# Patient Record
Sex: Female | Born: 1945
Health system: Southern US, Community
[De-identification: ages and names within clinical notes are randomized; demographics above are authoritative.]

## PROBLEM LIST (undated history)

## (undated) DIAGNOSIS — D649 Anemia, unspecified: Secondary | ICD-10-CM

## (undated) DIAGNOSIS — I10 Essential (primary) hypertension: Secondary | ICD-10-CM

## (undated) DIAGNOSIS — E079 Disorder of thyroid, unspecified: Secondary | ICD-10-CM

## (undated) HISTORY — PX: ABDOMINAL HYSTERECTOMY: SHX81

## (undated) HISTORY — PX: APPENDECTOMY: SHX54

---

## 2015-12-26 ENCOUNTER — Emergency Department (HOSPITAL_COMMUNITY)
Admission: EM | Admit: 2015-12-26 | Discharge: 2015-12-26 | Disposition: A | Discharge status: Home / Self Care | Payer: Commercial Managed Care - HMO | Attending: Emergency Medicine | Admitting: Emergency Medicine

## 2015-12-26 ENCOUNTER — Emergency Department (HOSPITAL_COMMUNITY): Payer: Commercial Managed Care - HMO

## 2015-12-26 ENCOUNTER — Encounter (HOSPITAL_COMMUNITY): Payer: Self-pay | Admitting: Emergency Medicine

## 2015-12-26 DIAGNOSIS — R05 Cough: Secondary | ICD-10-CM | POA: Diagnosis not present

## 2015-12-26 DIAGNOSIS — J40 Bronchitis, not specified as acute or chronic: Secondary | ICD-10-CM | POA: Diagnosis not present

## 2015-12-26 HISTORY — DX: Disorder of thyroid, unspecified: E07.9

## 2015-12-26 HISTORY — DX: Anemia, unspecified: D64.9

## 2015-12-26 HISTORY — DX: Essential (primary) hypertension: I10

## 2015-12-26 MED ORDER — AZITHROMYCIN 250 MG PO TABS: ORAL_TABLET | ORAL | 0 refills | Status: DC

## 2015-12-26 NOTE — ED Triage Notes (Addendum)
Patient c/o productive cough with sinus congestion/pressure and sore throat. Per patient started yesterday as a sore throat and cough started this morning. Patient reports thick green sputum. Per patient soreness and aching in chest with cough. Denies any fevers.

## 2015-12-26 NOTE — Discharge Instructions (Signed)
Take your blood pressure medicine. Follow-up with your family doctor if not getting better

## 2015-12-26 NOTE — ED Provider Notes (Signed)
[facility]PT Provider Note   CSN: [id number] Arrival date & time: 12/26/15  4098  By signing my name below, I, Majel Homer, attest that this documentation has been prepared under the direction and in the presence of Bethann Berkshire, MD . Electronically Signed: Majel Homer, Scribe. 12/26/2015. 10:27 AM.  History   Chief Complaint Chief Complaint  Patient presents with  . Cough   The history is provided by the patient. No language interpreter was used.  Cough  This is a new problem. The current episode started yesterday. The problem has been gradually worsening. The cough is productive of sputum. There has been no fever. Associated symptoms include headaches, sore throat and wheezing. Pertinent negatives include no chest pain. She has tried nothing for the symptoms. She is not a smoker.   HPI Comments: Maureen Sanchez is a 70 y.o. female who presents to the Emergency Department complaining of gradually worsening, productive cough with thick, green sputum that began yesterday. She notes associated wheezing, headache, sore throat, congestion and sinus pressure. Pt denies fever.   No past medical history on file.  There are no active problems to display for this patient.  No past surgical history on file.  OB History    No data available     Home Medications    Prior to Admission medications   Not on File    Family History No family history on file.  Social History Social History  Substance Use Topics  . Smoking status: Not on file  . Smokeless tobacco: Not on file  . Alcohol use Not on file     Allergies   Review of patient's allergies indicates not on file.   Review of Systems Review of Systems  Constitutional: Negative for appetite change, fatigue and fever.  HENT: Positive for congestion, sinus pressure and sore throat. Negative for ear discharge.   Eyes: Negative for discharge.  Respiratory: Positive for cough and wheezing.   Cardiovascular: Negative for chest  pain.  Gastrointestinal: Negative for abdominal pain and diarrhea.  Genitourinary: Negative for frequency and hematuria.  Musculoskeletal: Negative for back pain.  Skin: Negative for rash.  Neurological: Positive for headaches. Negative for seizures.  Psychiatric/Behavioral: Negative for hallucinations.   Physical Exam Updated Vital Signs BP (S) (!) 240/98 (BP Location: Right Arm) Comment: Patient states that she has high blood pressure but doesnt take her medication  Pulse 89   Temp 98.1 F (36.7 C) (Oral)   Resp 24   Ht 5\' 3"  (1.6 m)   Wt 140 lb (63.5 kg)   SpO2 99%   BMI 24.80 kg/m   Physical Exam  Constitutional: She is oriented to person, place, and time. She appears well-developed.  HENT:  Head: Normocephalic.  Eyes: Conjunctivae and EOM are normal. No scleral icterus.  Neck: Neck supple. No thyromegaly present.  Cardiovascular: Normal rate and regular rhythm.  Exam reveals no gallop and no friction rub.   No murmur heard. Pulmonary/Chest: No stridor. She has wheezes. She has no rales. She exhibits no tenderness.  Minimal wheezing   Abdominal: She exhibits no distension. There is no tenderness. There is no rebound.  Musculoskeletal: Normal range of motion. She exhibits no edema.  Lymphadenopathy:    She has no cervical adenopathy.  Neurological: She is oriented to person, place, and time. She exhibits normal muscle tone. Coordination normal.  Skin: No rash noted. No erythema.  Psychiatric: She has a normal mood and affect. Her behavior is normal.   ED Treatments /  Results  Labs (all labs ordered are listed, but only abnormal results are displayed) Labs Reviewed - No data to display  EKG  EKG Interpretation None       Radiology Dg Chest 2 View  Result Date: 12/26/2015 CLINICAL DATA:  Cough. EXAM: CHEST  2 VIEW COMPARISON:  03/26/2014 FINDINGS: The heart size is within normal limits. Stable mild pulmonary interstitial prominence likely reflects mild chronic  lung disease. There is no evidence of pulmonary edema, consolidation, pneumothorax, nodule or pleural fluid. Stable mild tortuosity of the thoracic aorta. The visualized skeletal structures are unremarkable. IMPRESSION: No active disease.  Suspect mild chronic interstitial lung disease. Electronically Signed   By: Irish LackGlenn  Yamagata M.D.   On: 12/26/2015 10:00   Procedures Procedures (including critical care time)  Medications Ordered in ED Medications - No data to display  DIAGNOSTIC STUDIES:  Oxygen Saturation is 99% on RA, normal by my interpretation.    COORDINATION OF CARE:  10:23 AM Discussed treatment plan with pt at bedside and pt agreed to plan.  Initial Impression / Assessment and Plan / ED Course  I have reviewed the triage vital signs and the nursing notes.  Pertinent labs & imaging results that were available during my care of the patient were reviewed by me and considered in my medical decision making (see chart for details).  Clinical Course    Patient with bronchitis she will given a Z-Pak. Patient also was told that she has to take her blood pressure medicine she's not been taking it and I explained the reasons.  I personally performed the services described in this documentation, which was scribed in my presence. The recorded information has been reviewed and is accurate.   Final Clinical Impressions(s) / ED Diagnoses   Final diagnoses:  None    New Prescriptions New Prescriptions   No medications on file  The chart was scribed for me under my direct supervision.  I personally performed the history, physical, and medical decision making and all procedures in the evaluation of this patient.Bethann Berkshire.    Montae Stager, MD 12/26/15 [phone]

## 2016-01-28 DIAGNOSIS — E785 Hyperlipidemia, unspecified: Secondary | ICD-10-CM | POA: Diagnosis not present

## 2016-01-28 DIAGNOSIS — E039 Hypothyroidism, unspecified: Secondary | ICD-10-CM | POA: Diagnosis not present

## 2016-01-28 DIAGNOSIS — I10 Essential (primary) hypertension: Secondary | ICD-10-CM | POA: Diagnosis not present

## 2016-02-04 DIAGNOSIS — I10 Essential (primary) hypertension: Secondary | ICD-10-CM | POA: Diagnosis not present

## 2016-02-04 DIAGNOSIS — E782 Mixed hyperlipidemia: Secondary | ICD-10-CM | POA: Diagnosis not present

## 2016-02-04 DIAGNOSIS — H539 Unspecified visual disturbance: Secondary | ICD-10-CM | POA: Diagnosis not present

## 2016-02-16 DIAGNOSIS — I10 Essential (primary) hypertension: Secondary | ICD-10-CM | POA: Diagnosis not present

## 2016-02-16 DIAGNOSIS — E782 Mixed hyperlipidemia: Secondary | ICD-10-CM | POA: Diagnosis not present

## 2016-02-16 DIAGNOSIS — H539 Unspecified visual disturbance: Secondary | ICD-10-CM | POA: Diagnosis not present

## 2016-03-03 DIAGNOSIS — E782 Mixed hyperlipidemia: Secondary | ICD-10-CM | POA: Diagnosis not present

## 2016-03-03 DIAGNOSIS — I10 Essential (primary) hypertension: Secondary | ICD-10-CM | POA: Diagnosis not present

## 2016-03-29 DIAGNOSIS — H25813 Combined forms of age-related cataract, bilateral: Secondary | ICD-10-CM | POA: Diagnosis not present

## 2016-04-19 ENCOUNTER — Encounter (HOSPITAL_COMMUNITY): Payer: Self-pay

## 2016-04-19 ENCOUNTER — Emergency Department (HOSPITAL_COMMUNITY): Payer: Medicare HMO

## 2016-04-19 ENCOUNTER — Emergency Department (HOSPITAL_COMMUNITY)
Admission: EM | Admit: 2016-04-19 | Discharge: 2016-04-19 | Disposition: A | Discharge status: Home / Self Care | Payer: Medicare HMO | Attending: Emergency Medicine | Admitting: Emergency Medicine

## 2016-04-19 DIAGNOSIS — I10 Essential (primary) hypertension: Secondary | ICD-10-CM

## 2016-04-19 DIAGNOSIS — H6691 Otitis media, unspecified, right ear: Secondary | ICD-10-CM | POA: Diagnosis not present

## 2016-04-19 DIAGNOSIS — H9201 Otalgia, right ear: Secondary | ICD-10-CM | POA: Insufficient documentation

## 2016-04-19 DIAGNOSIS — Z79899 Other long term (current) drug therapy: Secondary | ICD-10-CM | POA: Diagnosis not present

## 2016-04-19 DIAGNOSIS — H0520 Unspecified exophthalmos: Secondary | ICD-10-CM | POA: Diagnosis not present

## 2016-04-19 LAB — CBC WITH DIFFERENTIAL/PLATELET
BASOS PCT: 1 %
Basophils Absolute: 0.1 10*3/uL (ref 0.0–0.1)
EOS ABS: 0.3 10*3/uL (ref 0.0–0.7)
EOS PCT: 4 %
HCT: 42 % (ref 36.0–46.0)
Hemoglobin: 13.6 g/dL (ref 12.0–15.0)
Lymphocytes Relative: 22 %
Lymphs Abs: 1.7 10*3/uL (ref 0.7–4.0)
MCH: 29.2 pg (ref 26.0–34.0)
MCHC: 32.4 g/dL (ref 30.0–36.0)
MCV: 90.1 fL (ref 78.0–100.0)
Monocytes Absolute: 0.3 10*3/uL (ref 0.1–1.0)
Monocytes Relative: 5 %
Neutro Abs: 5.2 10*3/uL (ref 1.7–7.7)
Neutrophils Relative %: 68 %
PLATELETS: 253 10*3/uL (ref 150–400)
RBC: 4.66 MIL/uL (ref 3.87–5.11)
RDW: 14.1 % (ref 11.5–15.5)
WBC: 7.6 10*3/uL (ref 4.0–10.5)

## 2016-04-19 LAB — BASIC METABOLIC PANEL
ANION GAP: 8 (ref 5–15)
BUN: 14 mg/dL (ref 6–20)
CALCIUM: 9.1 mg/dL (ref 8.9–10.3)
CO2: 24 mmol/L (ref 22–32)
CREATININE: 0.98 mg/dL (ref 0.44–1.00)
Chloride: 105 mmol/L (ref 101–111)
GFR, EST NON AFRICAN AMERICAN: 56 mL/min — AB (ref >60)
Glucose, Bld: 106 mg/dL — ABNORMAL HIGH (ref 65–99)
Potassium: 3.7 mmol/L (ref 3.5–5.1)
Sodium: 137 mmol/L (ref 135–145)

## 2016-04-19 LAB — SEDIMENTATION RATE: SED RATE: 28 mm/h — AB (ref 0–22)

## 2016-04-19 MED ORDER — SODIUM CHLORIDE 0.9 % IV BOLUS (SEPSIS)
500.0000 mL | Freq: Once | INTRAVENOUS | Status: AC
  Administered 2016-04-19: 500 mL via INTRAVENOUS

## 2016-04-19 MED ORDER — FENTANYL CITRATE (PF) 100 MCG/2ML IJ SOLN
50.0000 ug | INTRAMUSCULAR | Status: DC | PRN
  Administered 2016-04-19 (×2): 50 ug via INTRAVENOUS
  Filled 2016-04-19 (×2): qty 2

## 2016-04-19 MED ORDER — AMOXICILLIN-POT CLAVULANATE 875-125 MG PO TABS: 1.0000 | ORAL_TABLET | Freq: Two times a day (BID) | ORAL | 0 refills | Status: AC

## 2016-04-19 MED ORDER — LABETALOL HCL 5 MG/ML IV SOLN
10.0000 mg | Freq: Once | INTRAVENOUS | Status: AC
  Administered 2016-04-19: 10 mg via INTRAVENOUS
  Filled 2016-04-19: qty 4

## 2016-04-19 MED ORDER — HYDROCODONE-ACETAMINOPHEN 5-325 MG PO TABS: 1.0000 | ORAL_TABLET | ORAL | 0 refills | Status: AC | PRN

## 2016-04-19 MED ORDER — IOPAMIDOL (ISOVUE-300) INJECTION 61%
75.0000 mL | Freq: Once | INTRAVENOUS | Status: AC | PRN
  Administered 2016-04-19: 75 mL via INTRAVENOUS

## 2016-04-19 NOTE — Discharge Instructions (Signed)
If you were given medicines take as directed.  If you are on coumadin or contraceptives realize their levels and effectiveness is altered by many different medicines.  If you have any reaction (rash, tongues swelling, other) to the medicines stop taking and see a physician.    If your blood pressure was elevated in the ER make sure you follow up for management with a primary doctor or return for chest pain, shortness of breath or stroke symptoms.  Please follow up as directed and return to the ER or see a physician for new or worsening symptoms.  Thank you. Vitals:   04/19/16 1200 04/19/16 1300 04/19/16 1400 04/19/16 1416  BP: (!) 229/88 (!) 219/91 (!) 234/116 200/90  Pulse:  65 78 70  Resp:      Temp:      TempSrc:      SpO2:  98% 100% 100%  Weight:      Height:

## 2016-04-19 NOTE — ED Notes (Signed)
Pt does not have a ride home, she drove herself to emergency department.  Explained to pt that she had to wait x4hours after receiving a narcotic pain medication by iv before driving.  Pt states she is not waiting here that long.  Pt informed that she could be charged with driving under the influence, pt continues to state she is leaving.

## 2016-04-19 NOTE — ED Triage Notes (Addendum)
Pt reports right ear pain x 3 days. Increasing in pain. Pain mainly behind the right ear

## 2016-04-19 NOTE — ED Provider Notes (Signed)
[facility] Provider Note   CSN: [id number] Arrival date & time: 04/19/16  4098  By signing my name below, I, Cynda Acres, attest that this documentation has been prepared under the direction and in the presence of Blane Ohara, MD. Electronically Signed: Cynda Acres, Scribe. 04/19/16. 9:54 AM.   History   Chief Complaint Chief Complaint  Patient presents with  . Otalgia   HPI Comments: Maureen Sanchez is a 71 y.o. female with a hx of HTN,  who presents to the Emergency Department complaining of gradually worsening, right ear pain that began 3 days ago. Patient states the pain is around her ear, not inside. When asked to point to the area of pain  she points at the mastoid process. Patient has no associated symptoms. Patient states the pain is severe. No modifying factors indicated. Patient denies any injury, fever, ear drainage, or headache.   The history is provided by the patient. No language interpreter was used.    Past Medical History:  Diagnosis Date  . Anemia   . Hypertension   . Thyroid disease     There are no active problems to display for this patient.   Past Surgical History:  Procedure Laterality Date  . ABDOMINAL HYSTERECTOMY    . APPENDECTOMY      OB History    Gravida Para Term Preterm AB Living   3 3 3     3    SAB TAB Ectopic Multiple Live Births                   Home Medications    Prior to Admission medications   Medication Sig Start Date End Date Taking? Authorizing Provider  azithromycin (ZITHROMAX Z-PAK) 250 MG tablet 2 po day one, then 1 daily x 4 days 12/26/15   Bethann Berkshire, MD    Family History Family History  Problem Relation Age of Onset  . Heart failure Mother   . Cancer Father     Social History Social History  Substance Use Topics  . Smoking status: Never Smoker  . Smokeless tobacco: Never Used  . Alcohol use No     Allergies   Patient has no known allergies.   Review of Systems Review of Systems    Constitutional: Negative for fever.  HENT: Positive for ear pain. Negative for ear discharge.   Neurological: Negative for headaches.  All other systems reviewed and are negative.    Physical Exam Updated Vital Signs BP (!) 230/110 (BP Location: Left Arm)   Pulse 98   Temp 97.5 F (36.4 C) (Oral)   Resp 16   Ht 5\' 3"  (1.6 m)   Wt 140 lb (63.5 kg)   SpO2 100%   BMI 24.80 kg/m   Physical Exam  Constitutional: She is oriented to person, place, and time. She appears well-developed.  The patient is uncomfortable.   HENT:  Head: Normocephalic and atraumatic.  Mouth/Throat: Oropharynx is clear and moist.  Left TM normal. No tenderness to the left ear. Mild surrounding erythema of the left ear. No drainage,  minimal serumen in the canal. TM minimal injection, no fluid line. Tenderness in right mastoid process. Pinna of the ear.   Eyes: Conjunctivae and EOM are normal. Pupils are equal, round, and reactive to light.  Neck: Normal range of motion. Neck supple.  Neck is supple.   Cardiovascular: Normal rate.   Pulmonary/Chest: Effort normal.  Abdominal: Soft. Bowel sounds are normal.  Musculoskeletal: Normal range of motion.  Neurological: She is alert and oriented to person, place, and time.  Skin: Skin is warm and dry.  Psychiatric: She has a normal mood and affect.     ED Treatments / Results  DIAGNOSTIC STUDIES: Oxygen Saturation is 100% on RA, normal by my interpretation.    COORDINATION OF CARE: 9:51 AM Discussed treatment plan with pt at bedside and pt agreed to plan, which includes pain medication.   Labs (all labs ordered are listed, but only abnormal results are displayed) Labs Reviewed - No data to display  EKG  EKG Interpretation None       Radiology No results found.  Procedures Procedures (including critical care time)  Medications Ordered in ED Medications - No data to display   Initial Impression / Assessment and Plan / ED Course  I have  reviewed the triage vital signs and the nursing notes.  Pertinent labs & imaging results that were available during my care of the patient were reviewed by me and considered in my medical decision making (see chart for details).   patient resents with right ear pain and mastoid tenderness. Discussed case with radiology CT with contrast no acute finding mastoid unremarkable. Patient improved in the ER patient did not take her blood pressure meds today. Pain meds given. Plan for antibiotics and close outpatient follow-up.  Results and differential diagnosis were discussed with the patient/parent/guardian. Xrays were independently reviewed by myself.  Close follow up outpatient was discussed, comfortable with the plan.   Medications  fentaNYL (SUBLIMAZE) injection 50 mcg (50 mcg Intravenous Given 04/19/16 1413)  sodium chloride 0.9 % bolus 500 mL (0 mLs Intravenous Stopped 04/19/16 1230)  iopamidol (ISOVUE-300) 61 % injection 75 mL (75 mLs Intravenous Contrast Given 04/19/16 1215)  labetalol (NORMODYNE,TRANDATE) injection 10 mg (10 mg Intravenous Given 04/19/16 1409)    Vitals:   04/19/16 1300 04/19/16 1400 04/19/16 1416 04/19/16 1459  BP: (!) 219/91 (!) 234/116 200/90 192/69  Pulse: 65 78 70 72  Resp:    18  Temp:      TempSrc:      SpO2: 98% 100% 100%   Weight:      Height:        Final diagnoses:  Otalgia of right ear  Essential hypertension     Final Clinical Impressions(s) / ED Diagnoses   Final diagnoses:  None    New Prescriptions New Prescriptions   No medications on file      Blane OharaJoshua Alda Gaultney, MD 04/19/16 1644

## 2016-08-30 DIAGNOSIS — E039 Hypothyroidism, unspecified: Secondary | ICD-10-CM | POA: Diagnosis not present

## 2016-08-30 DIAGNOSIS — E782 Mixed hyperlipidemia: Secondary | ICD-10-CM | POA: Diagnosis not present

## 2016-08-30 DIAGNOSIS — I10 Essential (primary) hypertension: Secondary | ICD-10-CM | POA: Diagnosis not present

## 2016-08-30 DIAGNOSIS — E785 Hyperlipidemia, unspecified: Secondary | ICD-10-CM | POA: Diagnosis not present

## 2016-08-30 DIAGNOSIS — R011 Cardiac murmur, unspecified: Secondary | ICD-10-CM | POA: Diagnosis not present

## 2016-08-31 DIAGNOSIS — E782 Mixed hyperlipidemia: Secondary | ICD-10-CM | POA: Diagnosis not present

## 2016-08-31 DIAGNOSIS — E039 Hypothyroidism, unspecified: Secondary | ICD-10-CM | POA: Diagnosis not present

## 2016-08-31 DIAGNOSIS — I10 Essential (primary) hypertension: Secondary | ICD-10-CM | POA: Diagnosis not present

## 2016-11-14 DIAGNOSIS — E538 Deficiency of other specified B group vitamins: Secondary | ICD-10-CM | POA: Diagnosis not present

## 2016-11-14 DIAGNOSIS — I10 Essential (primary) hypertension: Secondary | ICD-10-CM | POA: Diagnosis not present

## 2016-11-14 DIAGNOSIS — R739 Hyperglycemia, unspecified: Secondary | ICD-10-CM | POA: Diagnosis not present

## 2016-11-14 DIAGNOSIS — E039 Hypothyroidism, unspecified: Secondary | ICD-10-CM | POA: Diagnosis not present

## 2016-11-14 DIAGNOSIS — E785 Hyperlipidemia, unspecified: Secondary | ICD-10-CM | POA: Diagnosis not present

## 2016-11-14 DIAGNOSIS — E559 Vitamin D deficiency, unspecified: Secondary | ICD-10-CM | POA: Diagnosis not present

## 2016-11-17 DIAGNOSIS — I10 Essential (primary) hypertension: Secondary | ICD-10-CM | POA: Diagnosis not present

## 2016-12-12 DIAGNOSIS — E782 Mixed hyperlipidemia: Secondary | ICD-10-CM | POA: Diagnosis not present

## 2016-12-12 DIAGNOSIS — E039 Hypothyroidism, unspecified: Secondary | ICD-10-CM | POA: Diagnosis not present

## 2016-12-12 DIAGNOSIS — I10 Essential (primary) hypertension: Secondary | ICD-10-CM | POA: Diagnosis not present

## 2016-12-13 DIAGNOSIS — E785 Hyperlipidemia, unspecified: Secondary | ICD-10-CM | POA: Diagnosis not present

## 2016-12-13 DIAGNOSIS — I10 Essential (primary) hypertension: Secondary | ICD-10-CM | POA: Diagnosis not present

## 2016-12-13 DIAGNOSIS — E039 Hypothyroidism, unspecified: Secondary | ICD-10-CM | POA: Diagnosis not present

## 2016-12-20 DIAGNOSIS — K921 Melena: Secondary | ICD-10-CM | POA: Diagnosis not present

## 2016-12-20 DIAGNOSIS — E782 Mixed hyperlipidemia: Secondary | ICD-10-CM | POA: Diagnosis not present

## 2016-12-20 DIAGNOSIS — E559 Vitamin D deficiency, unspecified: Secondary | ICD-10-CM | POA: Diagnosis not present

## 2016-12-20 DIAGNOSIS — H0520 Unspecified exophthalmos: Secondary | ICD-10-CM | POA: Diagnosis not present

## 2016-12-20 DIAGNOSIS — R32 Unspecified urinary incontinence: Secondary | ICD-10-CM | POA: Diagnosis not present

## 2016-12-20 DIAGNOSIS — Z0001 Encounter for general adult medical examination with abnormal findings: Secondary | ICD-10-CM | POA: Diagnosis not present

## 2016-12-20 DIAGNOSIS — Z9114 Patient's other noncompliance with medication regimen: Secondary | ICD-10-CM | POA: Diagnosis not present

## 2016-12-20 DIAGNOSIS — E039 Hypothyroidism, unspecified: Secondary | ICD-10-CM | POA: Diagnosis not present

## 2016-12-20 DIAGNOSIS — R011 Cardiac murmur, unspecified: Secondary | ICD-10-CM | POA: Diagnosis not present

## 2017-01-13 DIAGNOSIS — I10 Essential (primary) hypertension: Secondary | ICD-10-CM | POA: Diagnosis not present

## 2017-01-13 DIAGNOSIS — E039 Hypothyroidism, unspecified: Secondary | ICD-10-CM | POA: Diagnosis not present

## 2017-01-13 DIAGNOSIS — E559 Vitamin D deficiency, unspecified: Secondary | ICD-10-CM | POA: Diagnosis not present

## 2017-02-07 DIAGNOSIS — E039 Hypothyroidism, unspecified: Secondary | ICD-10-CM | POA: Diagnosis not present

## 2017-02-07 DIAGNOSIS — N183 Chronic kidney disease, stage 3 (moderate): Secondary | ICD-10-CM | POA: Diagnosis not present

## 2017-02-07 DIAGNOSIS — E559 Vitamin D deficiency, unspecified: Secondary | ICD-10-CM | POA: Diagnosis not present

## 2017-02-07 DIAGNOSIS — I10 Essential (primary) hypertension: Secondary | ICD-10-CM | POA: Diagnosis not present

## 2017-02-07 DIAGNOSIS — E782 Mixed hyperlipidemia: Secondary | ICD-10-CM | POA: Diagnosis not present

## 2017-03-07 DIAGNOSIS — E039 Hypothyroidism, unspecified: Secondary | ICD-10-CM | POA: Diagnosis not present

## 2017-03-07 DIAGNOSIS — Z79899 Other long term (current) drug therapy: Secondary | ICD-10-CM | POA: Diagnosis not present

## 2017-03-07 DIAGNOSIS — I10 Essential (primary) hypertension: Secondary | ICD-10-CM | POA: Diagnosis not present

## 2017-03-07 DIAGNOSIS — Z9114 Patient's other noncompliance with medication regimen: Secondary | ICD-10-CM | POA: Diagnosis not present

## 2017-04-04 DIAGNOSIS — E039 Hypothyroidism, unspecified: Secondary | ICD-10-CM | POA: Diagnosis not present

## 2017-05-10 DIAGNOSIS — E039 Hypothyroidism, unspecified: Secondary | ICD-10-CM | POA: Diagnosis not present

## 2017-05-10 DIAGNOSIS — I10 Essential (primary) hypertension: Secondary | ICD-10-CM | POA: Diagnosis not present

## 2017-05-10 DIAGNOSIS — Z79899 Other long term (current) drug therapy: Secondary | ICD-10-CM | POA: Diagnosis not present

## 2017-05-30 IMAGING — CT CT TEMPORAL BONES W/ CM
2 of 6 series · 14 of 40 positions shown, 17 images · IV contrast (iopamidol)
Comparison: None.

CLINICAL DATA: Right ear pain for 3 days with mastoid tenderness.

EXAM:
CT TEMPORAL BONES WITH CONTRAST
TECHNIQUE: Axial and coronal plane CT imaging of the petrous temporal bones was
performed with thin-collimation image reconstruction after
intravenous contrast administration. Multiplanar CT image
reconstructions were also generated.
CONTRAST:  75mL [id number] IOPAMIDOL (P98ZNT-FYY) INJECTION 61%

[Series 4: coronal bone. · coronal · 0.22mm/px · 2 of 192 slices shown]
[im 64/192  bone]
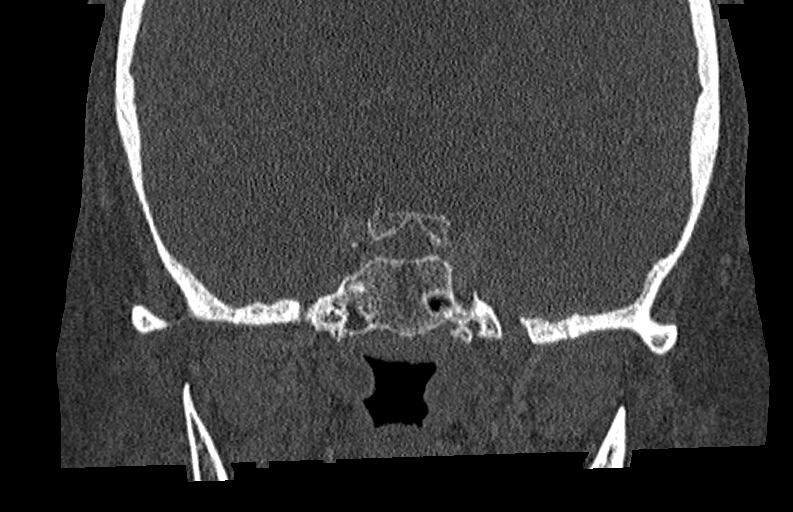
[im 128/192  bone]
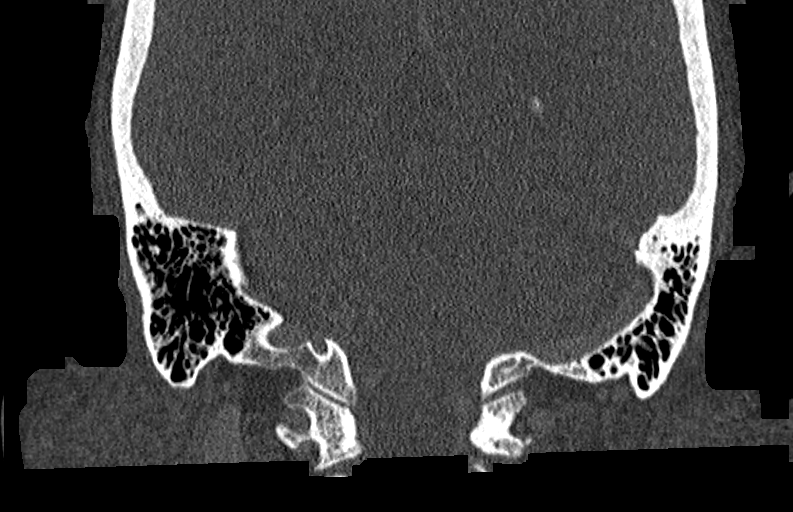

[Series 5: ax mag right · axial · 0.21mm/px · z∈[+1441,+1524]mm · 12 of 164 slices shown, 15 images]
[im 13/164  brain]
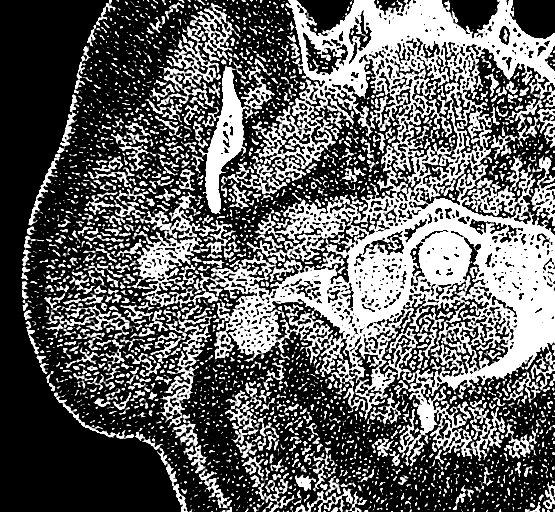
[im 13/164  bone]
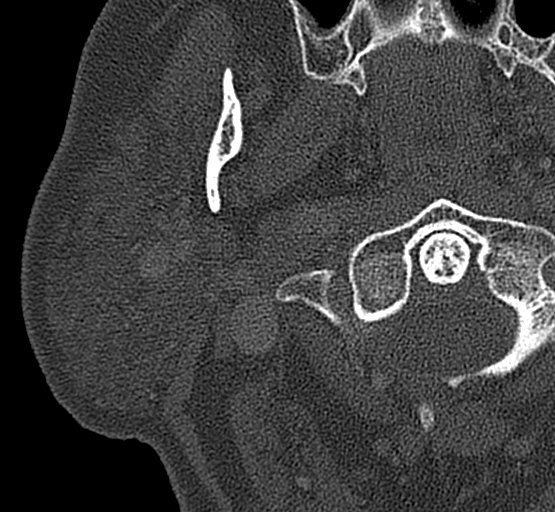
[im 26/164  bone]
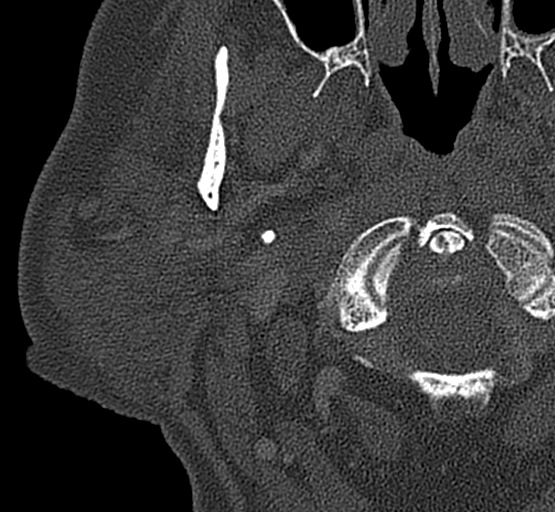
[im 38/164  bone]
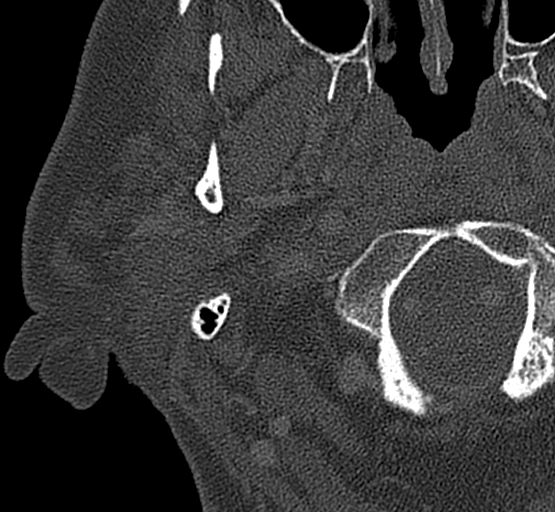
[im 51/164  bone]
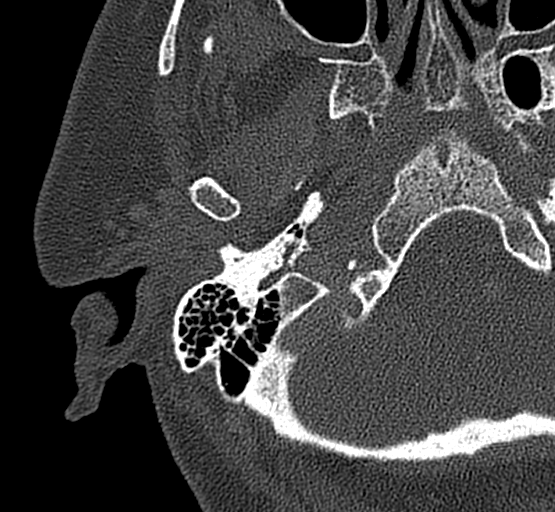
[im 63/164  brain]
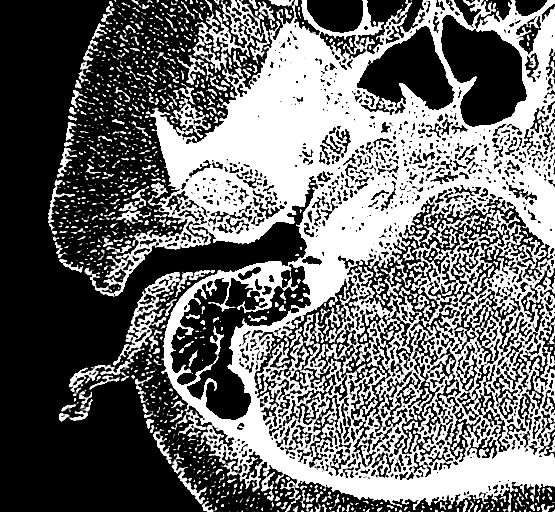
[im 63/164  bone]
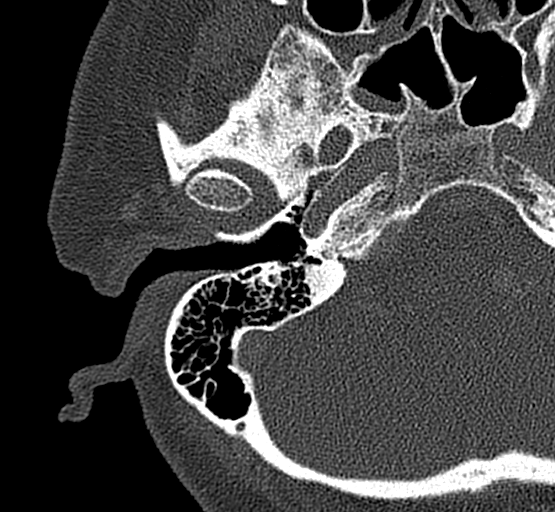
[im 76/164  bone]
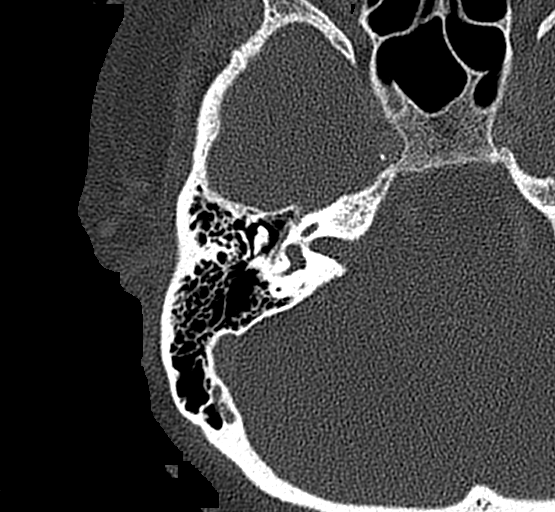
[im 88/164  bone]
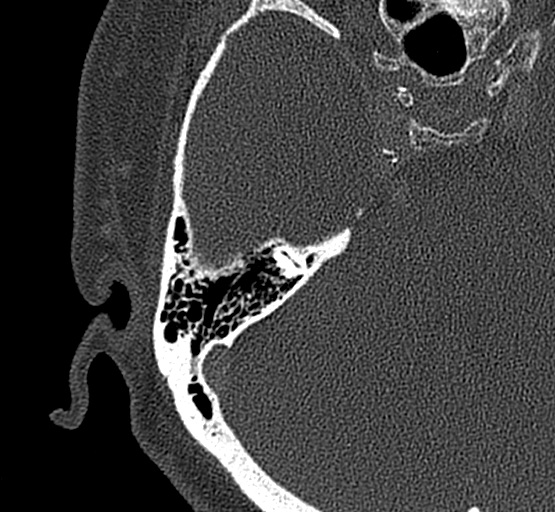
[im 101/164  bone]
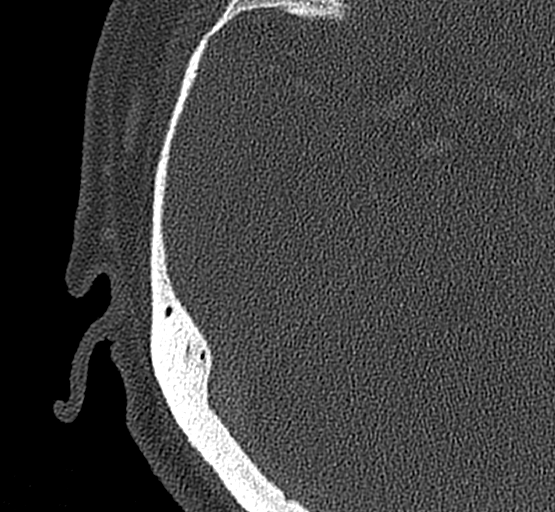
[im 113/164  brain]
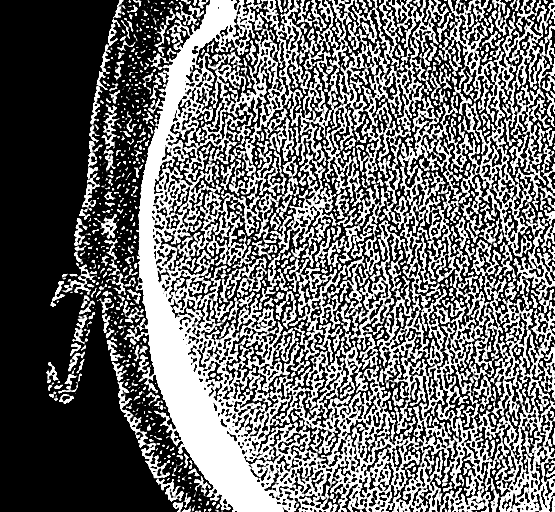
[im 113/164  bone]
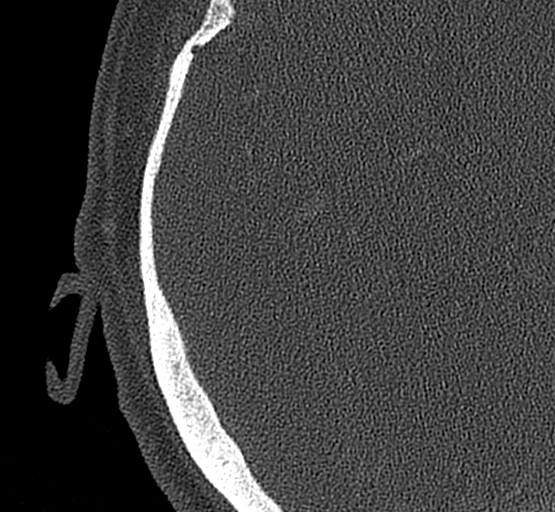
[im 126/164  bone]
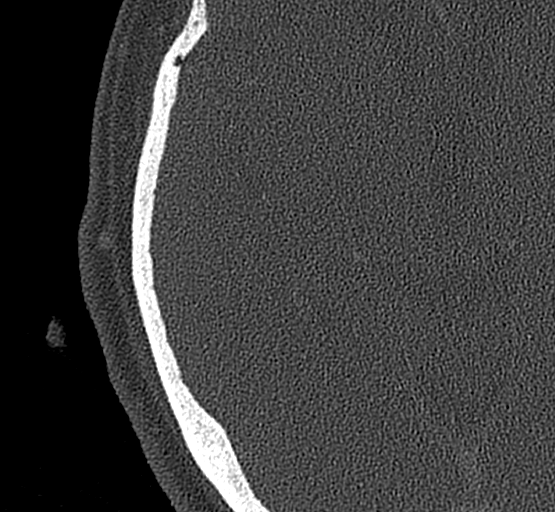
[im 138/164  bone]
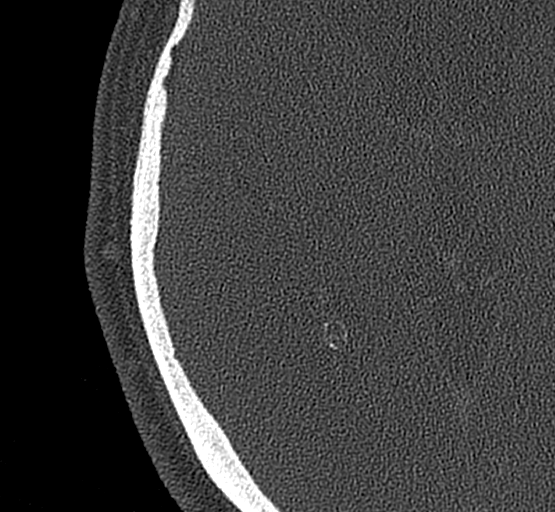
[im 151/164  bone]
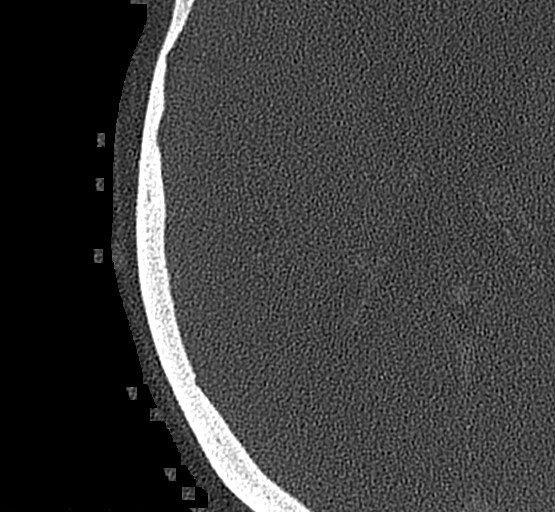

[14 of 40 positions shown; findings below may reference images not displayed]

FINDINGS: RIGHT: Minimal strandy debris in the external auditory canal. The
ossicles appear intact. The tympanic cavity is clear. The internal
auditory canal, cochlea, vestibule, and semicircular canals are
unremarkable. The mastoid air cells are clear.

LEFT: The external auditory canal and tympanic membrane are
unremarkable. The ossicles appear intact. The tympanic cavity is
clear. The internal auditory canal, cochlea, vestibule, and
semicircular canals are unremarkable. The mastoid air cells are
clear.

There is minimal fluid in the right sphenoid sinus. Bilateral
exophthalmos is noted along with mild low-density enlargement of
multiple extraocular muscles bilaterally, incompletely evaluated
though with most notable involvement of the inferior rectus muscle
on the left. The visualized portion of the brain demonstrates
cerebral atrophy and periventricular white matter hypoattenuation
suggesting chronic small vessel ischemia, incompletely evaluated.
IMPRESSION: 1. Unremarkable appearance of the temporal bones. No evidence of
mastoiditis or other acute abnormality. Minimal debris in the right
EAC.
2. Bilateral exophthalmos and extraocular muscle enlargement, likely
related to the patient's history of thyroid disease.

## 2017-07-26 DIAGNOSIS — Z0131 Encounter for examination of blood pressure with abnormal findings: Secondary | ICD-10-CM | POA: Diagnosis not present

## 2017-07-26 DIAGNOSIS — I10 Essential (primary) hypertension: Secondary | ICD-10-CM | POA: Diagnosis not present

## 2017-07-26 DIAGNOSIS — Z79899 Other long term (current) drug therapy: Secondary | ICD-10-CM | POA: Diagnosis not present

## 2017-07-26 DIAGNOSIS — E039 Hypothyroidism, unspecified: Secondary | ICD-10-CM | POA: Diagnosis not present

## 2017-08-01 DIAGNOSIS — E039 Hypothyroidism, unspecified: Secondary | ICD-10-CM | POA: Diagnosis not present

## 2017-08-01 DIAGNOSIS — I10 Essential (primary) hypertension: Secondary | ICD-10-CM | POA: Diagnosis not present

## 2017-08-09 DIAGNOSIS — Z79899 Other long term (current) drug therapy: Secondary | ICD-10-CM | POA: Diagnosis not present

## 2017-08-09 DIAGNOSIS — I10 Essential (primary) hypertension: Secondary | ICD-10-CM | POA: Diagnosis not present

## 2017-08-09 DIAGNOSIS — G4700 Insomnia, unspecified: Secondary | ICD-10-CM | POA: Diagnosis not present

## 2017-08-09 DIAGNOSIS — N179 Acute kidney failure, unspecified: Secondary | ICD-10-CM | POA: Diagnosis not present

## 2017-08-09 DIAGNOSIS — N185 Chronic kidney disease, stage 5: Secondary | ICD-10-CM | POA: Diagnosis not present

## 2017-08-09 DIAGNOSIS — R630 Anorexia: Secondary | ICD-10-CM | POA: Diagnosis not present

## 2017-08-09 DIAGNOSIS — Z9114 Patient's other noncompliance with medication regimen: Secondary | ICD-10-CM | POA: Diagnosis not present

## 2017-08-09 DIAGNOSIS — E039 Hypothyroidism, unspecified: Secondary | ICD-10-CM | POA: Diagnosis not present

## 2017-08-10 DIAGNOSIS — N179 Acute kidney failure, unspecified: Secondary | ICD-10-CM | POA: Diagnosis not present

## 2017-08-10 DIAGNOSIS — I10 Essential (primary) hypertension: Secondary | ICD-10-CM | POA: Diagnosis not present

## 2017-09-13 ENCOUNTER — Ambulatory Visit (HOSPITAL_COMMUNITY)
Admission: RE | Admit: 2017-09-13 | Discharge: 2017-09-13 | Disposition: A | Discharge status: Home / Self Care | Payer: Medicare HMO | Source: Ambulatory Visit | Attending: Nephrology | Admitting: Nephrology

## 2017-09-13 ENCOUNTER — Other Ambulatory Visit (HOSPITAL_COMMUNITY): Payer: Self-pay | Admitting: Nephrology

## 2017-09-13 DIAGNOSIS — E559 Vitamin D deficiency, unspecified: Secondary | ICD-10-CM | POA: Diagnosis not present

## 2017-09-13 DIAGNOSIS — I10 Essential (primary) hypertension: Secondary | ICD-10-CM | POA: Diagnosis not present

## 2017-09-13 DIAGNOSIS — N184 Chronic kidney disease, stage 4 (severe): Secondary | ICD-10-CM | POA: Insufficient documentation

## 2017-09-13 DIAGNOSIS — N179 Acute kidney failure, unspecified: Secondary | ICD-10-CM | POA: Diagnosis not present

## 2017-09-13 DIAGNOSIS — N183 Chronic kidney disease, stage 3 (moderate): Secondary | ICD-10-CM | POA: Diagnosis not present

## 2017-09-14 DIAGNOSIS — N184 Chronic kidney disease, stage 4 (severe): Secondary | ICD-10-CM | POA: Diagnosis not present

## 2017-09-14 DIAGNOSIS — Z79899 Other long term (current) drug therapy: Secondary | ICD-10-CM | POA: Diagnosis not present

## 2017-09-14 DIAGNOSIS — I10 Essential (primary) hypertension: Secondary | ICD-10-CM | POA: Diagnosis not present

## 2017-09-25 DIAGNOSIS — R809 Proteinuria, unspecified: Secondary | ICD-10-CM | POA: Diagnosis not present

## 2017-09-25 DIAGNOSIS — D509 Iron deficiency anemia, unspecified: Secondary | ICD-10-CM | POA: Diagnosis not present

## 2017-09-25 DIAGNOSIS — Z1159 Encounter for screening for other viral diseases: Secondary | ICD-10-CM | POA: Diagnosis not present

## 2017-09-25 DIAGNOSIS — N184 Chronic kidney disease, stage 4 (severe): Secondary | ICD-10-CM | POA: Diagnosis not present

## 2017-09-25 DIAGNOSIS — E559 Vitamin D deficiency, unspecified: Secondary | ICD-10-CM | POA: Diagnosis not present

## 2017-09-25 DIAGNOSIS — I10 Essential (primary) hypertension: Secondary | ICD-10-CM | POA: Diagnosis not present

## 2017-09-25 DIAGNOSIS — Z79899 Other long term (current) drug therapy: Secondary | ICD-10-CM | POA: Diagnosis not present

## 2017-09-25 DIAGNOSIS — N183 Chronic kidney disease, stage 3 (moderate): Secondary | ICD-10-CM | POA: Diagnosis not present

## 2017-09-26 ENCOUNTER — Ambulatory Visit (HOSPITAL_COMMUNITY): Payer: Medicare HMO

## 2017-10-04 DIAGNOSIS — Z79899 Other long term (current) drug therapy: Secondary | ICD-10-CM | POA: Diagnosis not present

## 2017-10-04 DIAGNOSIS — I10 Essential (primary) hypertension: Secondary | ICD-10-CM | POA: Diagnosis not present

## 2017-10-04 DIAGNOSIS — N185 Chronic kidney disease, stage 5: Secondary | ICD-10-CM | POA: Diagnosis not present

## 2017-10-04 DIAGNOSIS — E039 Hypothyroidism, unspecified: Secondary | ICD-10-CM | POA: Diagnosis not present

## 2017-10-05 DIAGNOSIS — D638 Anemia in other chronic diseases classified elsewhere: Secondary | ICD-10-CM | POA: Diagnosis not present

## 2017-10-05 DIAGNOSIS — E559 Vitamin D deficiency, unspecified: Secondary | ICD-10-CM | POA: Diagnosis not present

## 2017-10-05 DIAGNOSIS — D510 Vitamin B12 deficiency anemia due to intrinsic factor deficiency: Secondary | ICD-10-CM | POA: Diagnosis not present

## 2017-10-05 DIAGNOSIS — N184 Chronic kidney disease, stage 4 (severe): Secondary | ICD-10-CM | POA: Diagnosis not present

## 2017-10-05 DIAGNOSIS — N179 Acute kidney failure, unspecified: Secondary | ICD-10-CM | POA: Diagnosis not present

## 2017-10-05 DIAGNOSIS — N2581 Secondary hyperparathyroidism of renal origin: Secondary | ICD-10-CM | POA: Diagnosis not present

## 2017-10-11 DIAGNOSIS — Z79899 Other long term (current) drug therapy: Secondary | ICD-10-CM | POA: Diagnosis not present

## 2017-10-11 DIAGNOSIS — N183 Chronic kidney disease, stage 3 (moderate): Secondary | ICD-10-CM | POA: Diagnosis not present

## 2017-10-18 DIAGNOSIS — N183 Chronic kidney disease, stage 3 (moderate): Secondary | ICD-10-CM | POA: Diagnosis not present

## 2017-10-18 DIAGNOSIS — D510 Vitamin B12 deficiency anemia due to intrinsic factor deficiency: Secondary | ICD-10-CM | POA: Diagnosis not present

## 2017-10-18 DIAGNOSIS — Z79899 Other long term (current) drug therapy: Secondary | ICD-10-CM | POA: Diagnosis not present

## 2017-11-01 DIAGNOSIS — I10 Essential (primary) hypertension: Secondary | ICD-10-CM | POA: Diagnosis not present

## 2017-11-01 DIAGNOSIS — D510 Vitamin B12 deficiency anemia due to intrinsic factor deficiency: Secondary | ICD-10-CM | POA: Diagnosis not present

## 2017-12-27 DIAGNOSIS — Z79899 Other long term (current) drug therapy: Secondary | ICD-10-CM | POA: Diagnosis not present

## 2017-12-27 DIAGNOSIS — R739 Hyperglycemia, unspecified: Secondary | ICD-10-CM | POA: Diagnosis not present

## 2017-12-27 DIAGNOSIS — I10 Essential (primary) hypertension: Secondary | ICD-10-CM | POA: Diagnosis not present

## 2017-12-27 DIAGNOSIS — E039 Hypothyroidism, unspecified: Secondary | ICD-10-CM | POA: Diagnosis not present

## 2017-12-27 DIAGNOSIS — D510 Vitamin B12 deficiency anemia due to intrinsic factor deficiency: Secondary | ICD-10-CM | POA: Diagnosis not present

## 2017-12-27 DIAGNOSIS — E559 Vitamin D deficiency, unspecified: Secondary | ICD-10-CM | POA: Diagnosis not present

## 2017-12-27 DIAGNOSIS — N185 Chronic kidney disease, stage 5: Secondary | ICD-10-CM | POA: Diagnosis not present

## 2017-12-27 DIAGNOSIS — E785 Hyperlipidemia, unspecified: Secondary | ICD-10-CM | POA: Diagnosis not present

## 2018-01-03 DIAGNOSIS — E039 Hypothyroidism, unspecified: Secondary | ICD-10-CM | POA: Diagnosis not present

## 2018-01-03 DIAGNOSIS — D510 Vitamin B12 deficiency anemia due to intrinsic factor deficiency: Secondary | ICD-10-CM | POA: Diagnosis not present

## 2018-01-03 DIAGNOSIS — E782 Mixed hyperlipidemia: Secondary | ICD-10-CM | POA: Diagnosis not present

## 2018-01-03 DIAGNOSIS — N185 Chronic kidney disease, stage 5: Secondary | ICD-10-CM | POA: Diagnosis not present

## 2018-01-03 DIAGNOSIS — I10 Essential (primary) hypertension: Secondary | ICD-10-CM | POA: Diagnosis not present

## 2018-01-03 DIAGNOSIS — Z79899 Other long term (current) drug therapy: Secondary | ICD-10-CM | POA: Diagnosis not present

## 2018-01-03 DIAGNOSIS — E559 Vitamin D deficiency, unspecified: Secondary | ICD-10-CM | POA: Diagnosis not present

## 2018-01-03 DIAGNOSIS — D649 Anemia, unspecified: Secondary | ICD-10-CM | POA: Diagnosis not present

## 2018-02-07 DIAGNOSIS — D510 Vitamin B12 deficiency anemia due to intrinsic factor deficiency: Secondary | ICD-10-CM | POA: Diagnosis not present

## 2018-04-03 DIAGNOSIS — Z79899 Other long term (current) drug therapy: Secondary | ICD-10-CM | POA: Diagnosis not present

## 2018-04-03 DIAGNOSIS — E785 Hyperlipidemia, unspecified: Secondary | ICD-10-CM | POA: Diagnosis not present

## 2018-04-03 DIAGNOSIS — I10 Essential (primary) hypertension: Secondary | ICD-10-CM | POA: Diagnosis not present

## 2018-04-03 DIAGNOSIS — D510 Vitamin B12 deficiency anemia due to intrinsic factor deficiency: Secondary | ICD-10-CM | POA: Diagnosis not present

## 2018-04-03 DIAGNOSIS — E039 Hypothyroidism, unspecified: Secondary | ICD-10-CM | POA: Diagnosis not present

## 2018-04-04 DIAGNOSIS — E785 Hyperlipidemia, unspecified: Secondary | ICD-10-CM | POA: Diagnosis not present

## 2018-04-04 DIAGNOSIS — D510 Vitamin B12 deficiency anemia due to intrinsic factor deficiency: Secondary | ICD-10-CM | POA: Diagnosis not present

## 2018-04-04 DIAGNOSIS — I10 Essential (primary) hypertension: Secondary | ICD-10-CM | POA: Diagnosis not present

## 2018-04-04 DIAGNOSIS — E039 Hypothyroidism, unspecified: Secondary | ICD-10-CM | POA: Diagnosis not present

## 2020-03-05 IMAGING — US US RENAL
1 series · 14 of 25 positions shown · non-contrast
Comparison: None.

CLINICAL DATA: Chronic kidney disease stage 4.

EXAM:
RENAL / URINARY TRACT ULTRASOUND COMPLETE

[Series 1: us renal · 0.23mm/px · 14 of 46 slices shown]
[im 1/46]
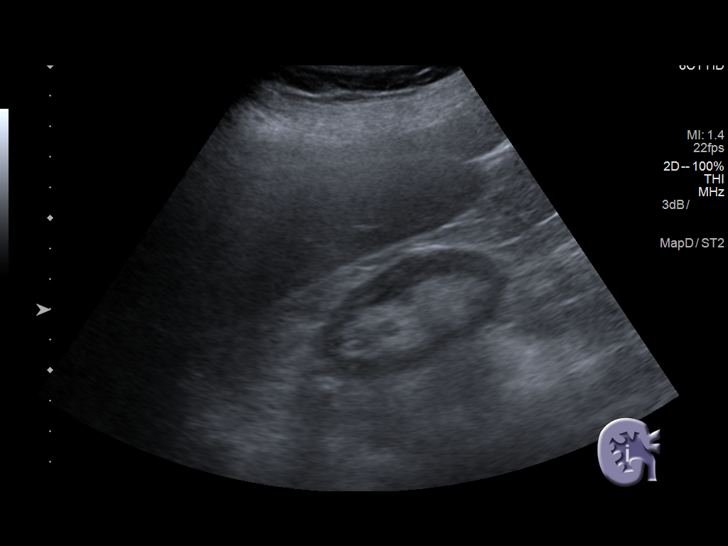
[im 4/46]
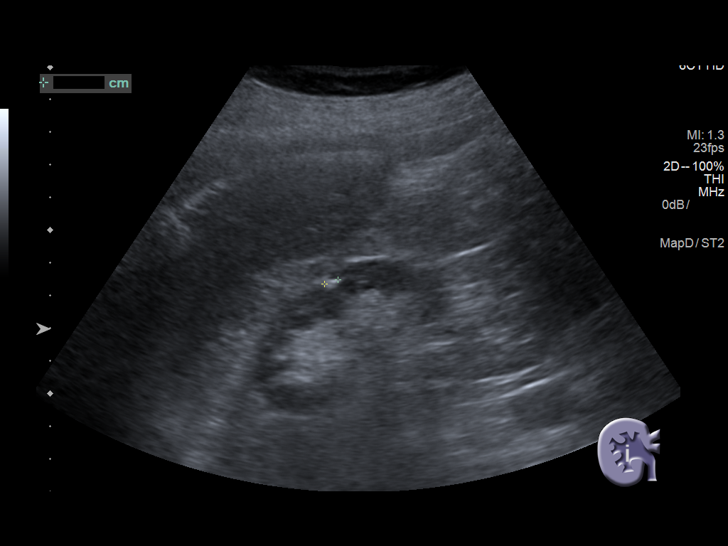
[im 8/46]
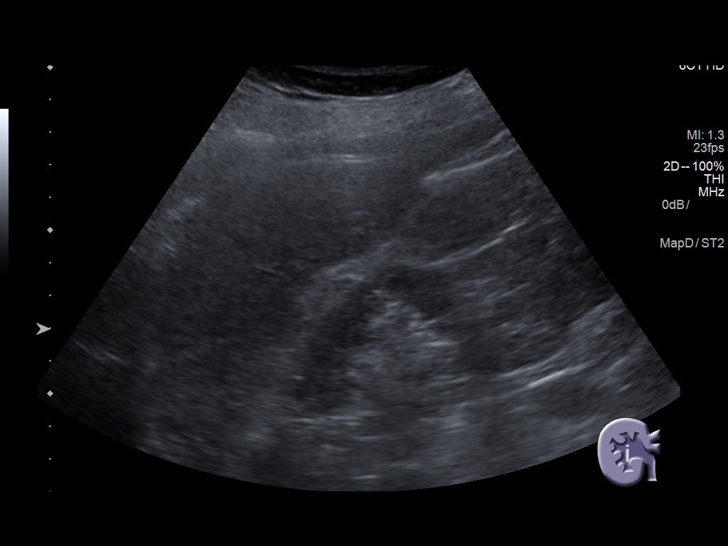
[im 12/46]
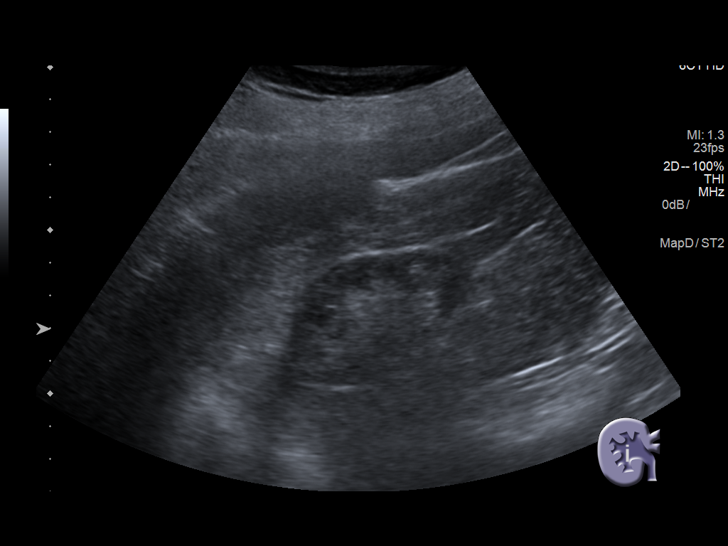
[im 16/46]
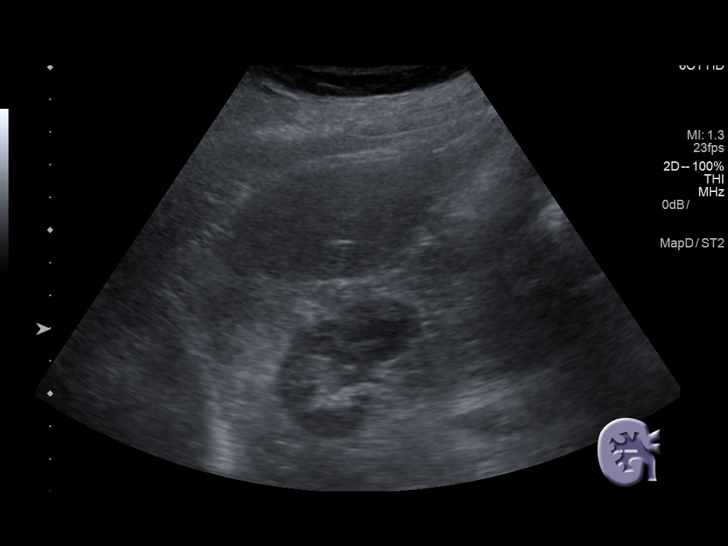
[im 17/46]
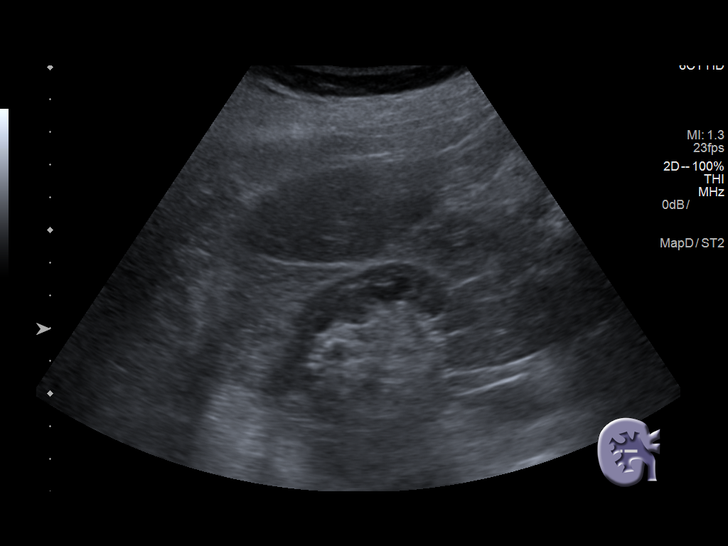
[im 21/46]
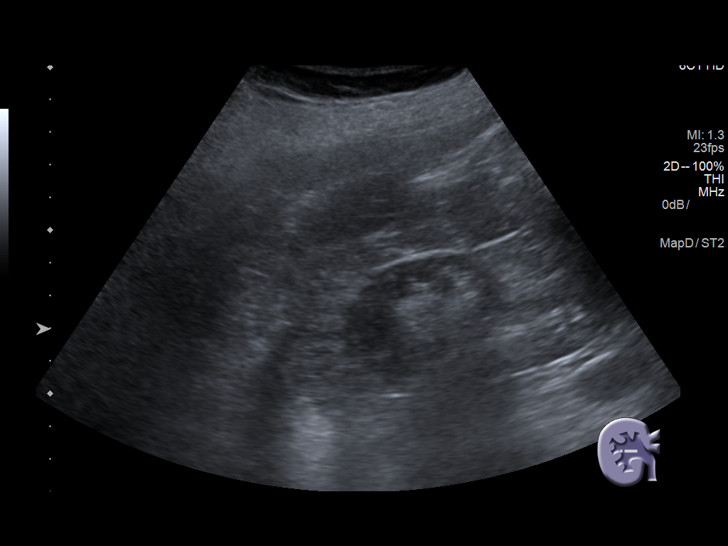
[im 25/46]
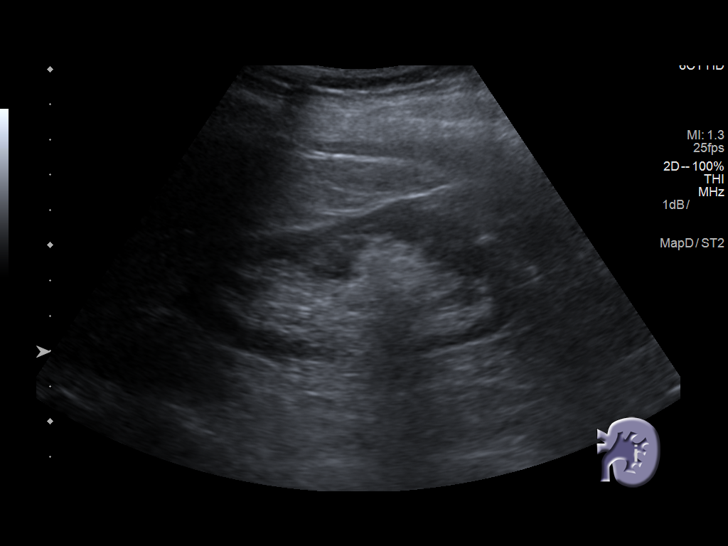
[im 29/46]
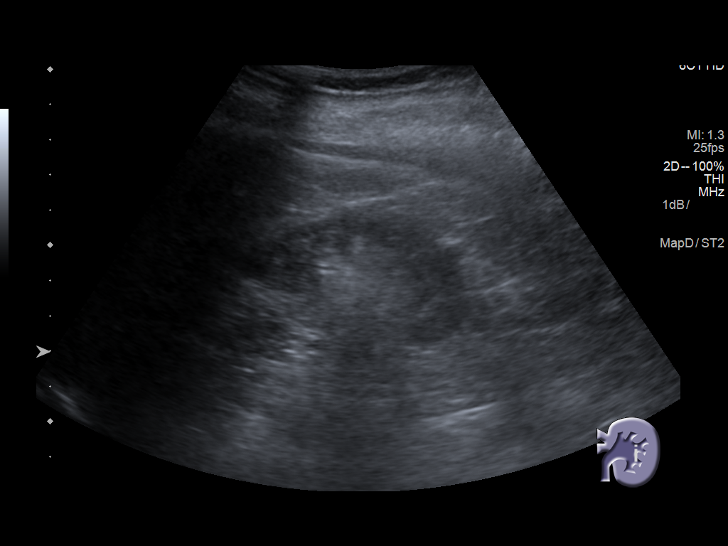
[im 31/46]
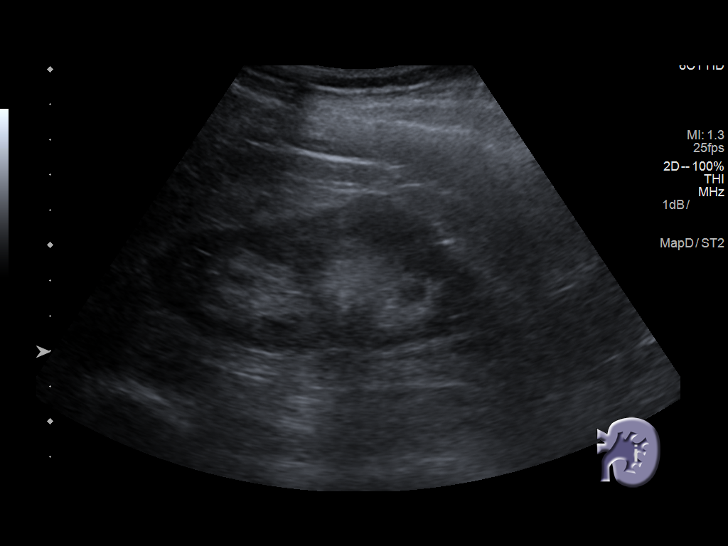
[im 34/46]
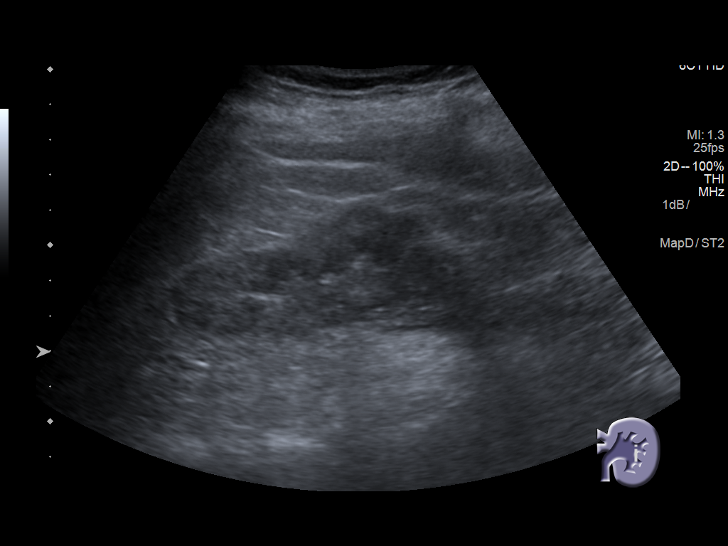
[im 38/46]
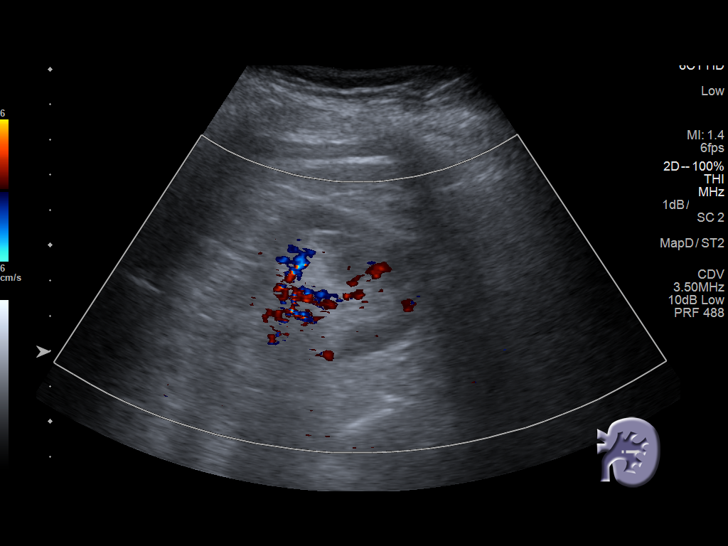
[im 42/46]
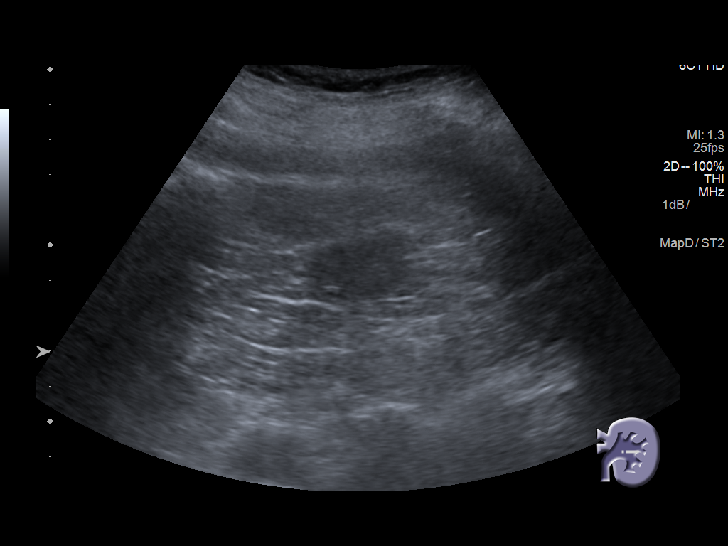
[im 46/46]
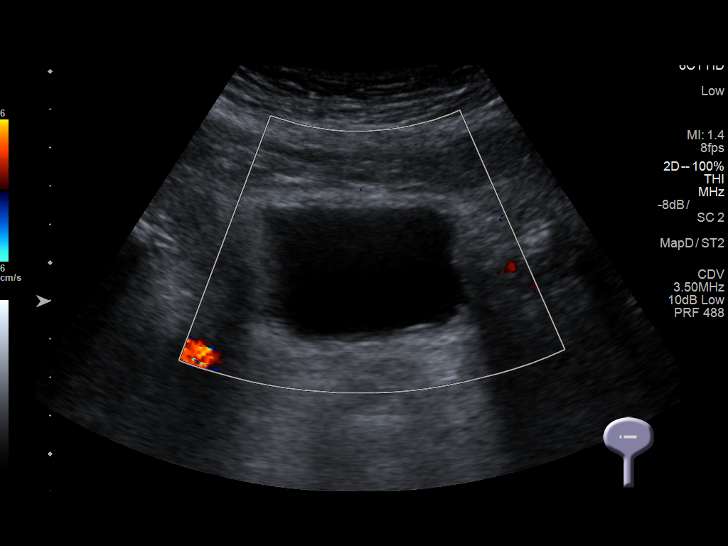

[14 of 25 positions shown; findings below may reference images not displayed]

FINDINGS: Right Kidney:

Length: 7 cm. 4 mm cortical calcification is seen in midpole.
Echogenicity within normal limits. No mass or hydronephrosis
visualized.

Left Kidney:

Length: 9.5 cm. Echogenicity within normal limits. No mass or
hydronephrosis visualized.

Bladder:

Appears normal for degree of bladder distention. Ureteral jets are
not visualized.
IMPRESSION: Right renal atrophy is noted. No hydronephrosis or renal obstruction
is noted.
